# Patient Record
Sex: Male | Born: 1970 | Race: White | Hispanic: No | Smoking: Current every day smoker
Health system: Southern US, Community
[De-identification: ages and names within clinical notes are randomized; demographics above are authoritative.]

## PROBLEM LIST (undated history)

## (undated) DIAGNOSIS — I639 Cerebral infarction, unspecified: Secondary | ICD-10-CM

## (undated) DIAGNOSIS — F319 Bipolar disorder, unspecified: Secondary | ICD-10-CM

## (undated) DIAGNOSIS — R569 Unspecified convulsions: Secondary | ICD-10-CM

## (undated) DIAGNOSIS — C801 Malignant (primary) neoplasm, unspecified: Secondary | ICD-10-CM

## (undated) HISTORY — PX: BACK SURGERY: SHX140

## (undated) HISTORY — PX: FRACTURE SURGERY: SHX138

---

## 2010-11-15 LAB — PULMONARY FUNCTION TEST

## 2012-08-07 ENCOUNTER — Encounter (HOSPITAL_BASED_OUTPATIENT_CLINIC_OR_DEPARTMENT_OTHER): Payer: Self-pay | Admitting: Emergency Medicine

## 2012-08-07 ENCOUNTER — Emergency Department (HOSPITAL_BASED_OUTPATIENT_CLINIC_OR_DEPARTMENT_OTHER): Payer: Self-pay

## 2012-08-07 ENCOUNTER — Emergency Department (HOSPITAL_BASED_OUTPATIENT_CLINIC_OR_DEPARTMENT_OTHER)
Admission: EM | Admit: 2012-08-07 | Discharge: 2012-08-07 | Disposition: A | Discharge status: Home / Self Care | Payer: Self-pay | Attending: Emergency Medicine | Admitting: Emergency Medicine

## 2012-08-07 DIAGNOSIS — R0602 Shortness of breath: Secondary | ICD-10-CM | POA: Insufficient documentation

## 2012-08-07 DIAGNOSIS — F319 Bipolar disorder, unspecified: Secondary | ICD-10-CM | POA: Insufficient documentation

## 2012-08-07 DIAGNOSIS — Z8673 Personal history of transient ischemic attack (TIA), and cerebral infarction without residual deficits: Secondary | ICD-10-CM | POA: Insufficient documentation

## 2012-08-07 DIAGNOSIS — F17200 Nicotine dependence, unspecified, uncomplicated: Secondary | ICD-10-CM | POA: Insufficient documentation

## 2012-08-07 DIAGNOSIS — M549 Dorsalgia, unspecified: Secondary | ICD-10-CM | POA: Insufficient documentation

## 2012-08-07 DIAGNOSIS — J40 Bronchitis, not specified as acute or chronic: Secondary | ICD-10-CM

## 2012-08-07 DIAGNOSIS — M542 Cervicalgia: Secondary | ICD-10-CM | POA: Insufficient documentation

## 2012-08-07 HISTORY — DX: Cerebral infarction, unspecified: I63.9

## 2012-08-07 HISTORY — DX: Malignant (primary) neoplasm, unspecified: C80.1

## 2012-08-07 HISTORY — DX: Bipolar disorder, unspecified: F31.9

## 2012-08-07 HISTORY — DX: Unspecified convulsions: R56.9

## 2012-08-07 LAB — BASIC METABOLIC PANEL
CO2: 23 mEq/L (ref 19–32)
Chloride: 104 mEq/L (ref 96–112)
Creatinine, Ser: 0.9 mg/dL (ref 0.50–1.35)
Potassium: 3.9 mEq/L (ref 3.5–5.1)

## 2012-08-07 LAB — CBC WITH DIFFERENTIAL/PLATELET
Basophils Absolute: 0.1 10*3/uL (ref 0.0–0.1)
HCT: 41.6 % (ref 39.0–52.0)
Hemoglobin: 14.5 g/dL (ref 13.0–17.0)
Lymphocytes Relative: 44 % (ref 12–46)
Monocytes Absolute: 0.9 10*3/uL (ref 0.1–1.0)
Neutro Abs: 5.1 10*3/uL (ref 1.7–7.7)
Neutrophils Relative %: 45 % (ref 43–77)
RDW: 13.2 % (ref 11.5–15.5)
WBC: 11.6 10*3/uL — ABNORMAL HIGH (ref 4.0–10.5)

## 2012-08-07 MED ORDER — CYCLOBENZAPRINE HCL 10 MG PO TABS: 10.0000 mg | ORAL_TABLET | Freq: Three times a day (TID) | ORAL | Status: AC | PRN

## 2012-08-07 MED ORDER — ALBUTEROL SULFATE HFA 108 (90 BASE) MCG/ACT IN AERS
2.0000 | INHALATION_SPRAY | RESPIRATORY_TRACT | Status: DC | PRN
  Administered 2012-08-07: 2 via RESPIRATORY_TRACT
  Filled 2012-08-07: qty 6.7

## 2012-08-07 NOTE — ED Notes (Signed)
C/O neck pain, back pain, headache and sob. Hx of testicular ca, back sx. Hx of "abscence seizures". Has had these sx for "months" but are worsening. In NAD.

## 2012-08-07 NOTE — ED Notes (Signed)
PT REQUESTS NO NARCOTICS-RECOVERING ADDICT

## 2012-08-07 NOTE — ED Provider Notes (Signed)
History     CSN: [id number]  Arrival date & time 08/07/12  4098   First MD Initiated Contact with Patient 08/07/12 [phone]      Chief Complaint  Patient presents with  . Shortness of Breath  . Neck Pain  . Back Pain    (Consider location/radiation/quality/duration/timing/severity/associated sxs/prior treatment) HPI Pt reports a history of chronic back and neck pain, moderate to severe worse with walking and worsening for the last several weeks. No injuries. His more pressing complaint is SOB which is worse with walking or lying flat associated with productive cough.  This is also a chronic complaint for him, he has had PFTs in the past but his outpatient evaluation was interrupted by loss of insurance. He is a smoker. No CP, leg swelling or travel.  Past Medical History  Diagnosis Date  . Cancer   . Stroke   . Seizures   . Bipolar 1 disorder     Past Surgical History  Procedure Date  . Back surgery   . Fracture surgery     No family history on file.  History  Substance Use Topics  . Smoking status: Current Everyday Smoker -- 0.5 packs/day    Types: Cigarettes  . Smokeless tobacco: Not on file  . Alcohol Use: No      Review of Systems All other systems reviewed and are negative except as noted in HPI.   Allergies  Review of patient's allergies indicates no known allergies.  Home Medications   Current Outpatient Rx  Name Route Sig Dispense Refill  . DULOXETINE HCL 60 MG PO CPEP Oral Take 60 mg by mouth daily.    Marland Kitchen HYDROXYZINE HCL 10 MG PO TABS Oral Take 10 mg by mouth 3 (three) times daily as needed.    . TOPIRAMATE 25 MG PO CPSP Oral Take 25 mg by mouth 2 (two) times daily.      BP 112/77  Pulse 82  Temp 98.5 F (36.9 C) (Oral)  Resp 18  Ht 6\' 1"  (1.854 m)  Wt 214 lb (97.07 kg)  BMI 28.23 kg/m2  SpO2 98%  Physical Exam  Nursing note and vitals reviewed. Constitutional: He is oriented to person, place, and time. He appears well-developed and  well-nourished.  HENT:  Head: Normocephalic and atraumatic.  Eyes: EOM are normal. Pupils are equal, round, and reactive to light.  Neck: Normal range of motion. Neck supple.  Cardiovascular: Normal rate, normal heart sounds and intact distal pulses.   Pulmonary/Chest: Effort normal and breath sounds normal.  Abdominal: Bowel sounds are normal. He exhibits no distension. There is no tenderness.  Musculoskeletal: Normal range of motion. He exhibits no edema and no tenderness.  Neurological: He is alert and oriented to person, place, and time. He has normal strength. No cranial nerve deficit or sensory deficit.  Skin: Skin is warm and dry. No rash noted.  Psychiatric: He has a normal mood and affect.    ED Course  Procedures (including critical care time)  Labs Reviewed  CBC WITH DIFFERENTIAL - Abnormal; Notable for the following:    WBC 11.6 (*)     Lymphs Abs 5.1 (*)     All other components within normal limits  BASIC METABOLIC PANEL - Abnormal; Notable for the following:    Glucose, Bld 147 (*)     All other components within normal limits   Dg Chest 2 View  08/07/2012  *RADIOLOGY REPORT*  Clinical Data: Chest pain and shortness of breath.  CHEST -  2 VIEW  Comparison: None  Findings: The cardiac silhouette, mediastinal and hilar contours are within normal limits.  There are bronchitic type lung changes with peribronchial thickening and increased interstitial markings suggesting bronchitis or interstitial pneumonitis.  No focal airspace consolidation or pleural effusion.  The bony thorax is intact.  IMPRESSION: Bronchitic type lung changes suggesting bronchitis or interstitial pneumonitis.  No focal airspace consolidation.   Original Report Authenticated By: P. Loralie Champagne, M.D.      No diagnosis found.    MDM  Exam is unremarkable. Pt has several chronic complaints apparently not well managed by current PCP. Will check basic labs, CXR. Pt states he does not take opiates due  to prior history of substance abuse. He attributes his breathing issues to a period of intubation several years ago following a Seroquel overdose.   11:11 AM CXR as above consistent with symptoms. No concern for acute CHF, pneumonia, PE or ACS. Advised followup with PCP and pulmonology for further evaluation. Given inhaler for SOB, advised to stop smoking. Continue NSAIDs, muscle relaxer for back pain.       Charles B. Bernette Mayers, MD 08/07/12 1112

## 2013-03-24 ENCOUNTER — Ambulatory Visit (INDEPENDENT_AMBULATORY_CARE_PROVIDER_SITE_OTHER): Payer: No Typology Code available for payment source | Admitting: Pulmonary Disease

## 2013-03-24 ENCOUNTER — Encounter: Payer: Self-pay | Admitting: Pulmonary Disease

## 2013-03-24 VITALS — BP 96/62 | HR 84 | Temp 97.7°F | Ht 73.0 in | Wt 246.0 lb

## 2013-03-24 DIAGNOSIS — R911 Solitary pulmonary nodule: Secondary | ICD-10-CM

## 2013-03-24 DIAGNOSIS — R0600 Dyspnea, unspecified: Secondary | ICD-10-CM | POA: Insufficient documentation

## 2013-03-24 DIAGNOSIS — R0989 Other specified symptoms and signs involving the circulatory and respiratory systems: Secondary | ICD-10-CM

## 2013-03-24 DIAGNOSIS — G4733 Obstructive sleep apnea (adult) (pediatric): Secondary | ICD-10-CM

## 2013-03-24 NOTE — Assessment & Plan Note (Signed)
Lung function is decreased - FVC dropped from 66% in 2011 to 56% 2014 You have to quit smoking -counselled Use albuterol 2 puffs every 6h as needed for shortness of breath or wheezing

## 2013-03-24 NOTE — Progress Notes (Signed)
Subjective:    Patient ID: Kyle Sanders, male    DOB: 12/09/1971, 42 y.o.   MRN: [id number]  HPI 42 year old heavy smoker presents for evaluation of dyspnea and loud snoring. He has a history of bipolar disorder and substance abuse. He quit using street drugs such as cocaine in 1995. He is an extensive hospitalization in [place] in 2010 after an overdose of Seroquel when he required mechanical ventilation and cardiac resuscitation. 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. He also has a history of testicular cancer in remission since 2008. He ambulates with a cane and reports dyspnea on exertion. He denies wheezing frequent chest colds or episodes of orthopnea or paroxysmal nocturnal dyspnea. PFTs in 2011 showed an FEV1 of 68% and FVC of 66% with a ratio of 85 suggesting moderate restriction. DLCO was decreased at 58% but corrected for alveolar volume. Lung volumes were decreased. He did have a 20% drop in FEV1 after broncho- challenge.  Spirometry today showed an FEV1 of 65% and FVC of 56% with a normal ratio against suggesting restriction CXR 08/07/12 showed Bronchitic type lung changes suggesting bronchitis or interstitial pneumonitis. Cardiac evaluation including nuclear imaging on 03/19/2013 was normal with an EF of 64%. A recent drug screen was negative as was a hypercoagulable panel on review of his labs.  A lung nodule is reported in his medical history from PCP but I did not see any chest x-ray or CT report.  He also reports witnessed apneas and loud snoring. Bedtime is 2 AM to sleep latency around 2 hours and several nocturnal awakenings he is out of bed by 7 AM, feeling tired and unrefreshed with dryness of mouth. He is gained 50 pounds in the last 2 years. There is no history suggestive of cataplexy, sleep paralysis or parasomnias    Past Medical History  Diagnosis Date  . Cancer   . Stroke   . Seizures   . Bipolar 1  disorder    Past Surgical History  Procedure Laterality Date  . Back surgery    . Fracture surgery      Allergies  Allergen Reactions  . Lamictal (Lamotrigine) Rash   History   Social History  . Marital Status: Divorced    Spouse Name: N/A    Number of Children: N/A  . Years of Education: N/A   Occupational History  . Not on file.   Social History Main Topics  . Smoking status: Current Every Day Smoker -- 1.00 packs/day for 30 years    Types: Cigarettes  . Smokeless tobacco: Not on file  . Alcohol Use: No  . Drug Use: No     Comment: history of illegal drug use  . Sexually Active: Not on file   Other Topics Concern  . Not on file   Social History Narrative  . No narrative on file   History reviewed. No pertinent family history.    Review of Systems  Constitutional: Positive for unexpected weight change. Negative for fever, chills, diaphoresis, activity change, appetite change and fatigue.  HENT: Positive for ear pain, congestion and sore throat. Negative for hearing loss, nosebleeds, facial swelling, rhinorrhea, sneezing, mouth sores, trouble swallowing, neck pain, neck stiffness, dental problem, voice change, postnasal drip, sinus pressure, tinnitus and ear discharge.   Eyes: Negative for photophobia, discharge, itching and visual disturbance.  Respiratory: Positive for cough and shortness of breath. Negative for apnea, choking, chest tightness, wheezing and stridor.  Cardiovascular: Positive for chest pain. Negative for palpitations and leg swelling.  Gastrointestinal: Positive for abdominal pain. Negative for nausea, vomiting, constipation, blood in stool and abdominal distention.  Genitourinary: Negative for dysuria, urgency, frequency, hematuria, flank pain, decreased urine volume and difficulty urinating.  Musculoskeletal: Negative for myalgias, back pain, joint swelling, arthralgias and gait problem.  Skin: Negative for color change, pallor and rash.   Neurological: Positive for headaches. Negative for dizziness, tremors, seizures, syncope, speech difficulty, weakness, light-headedness and numbness.  Hematological: Negative for adenopathy. Does not bruise/bleed easily.  Psychiatric/Behavioral: Negative for confusion, sleep disturbance and agitation. The patient is not nervous/anxious.        Objective:   Physical Exam  Gen. Pleasant, well-nourished, in no distress, normal affect ENT - no lesions, no post nasal drip, pierced tongue Neck: No JVD, no thyromegaly, no carotid bruits Lungs: no use of accessory muscles, no dullness to percussion, clear without rales or rhonchi  Cardiovascular: Rhythm regular, heart sounds  normal, no murmurs or gallops, no peripheral edema Abdomen: soft and non-tender, no hepatosplenomegaly, BS normal. Musculoskeletal: No deformities, no cyanosis or clubbing, ambulates with walker Neuro:  alert, non focal       Assessment & Plan:

## 2013-03-24 NOTE — Assessment & Plan Note (Signed)
CT scan of chest to clarify lung nodule

## 2013-03-24 NOTE — Patient Instructions (Addendum)
Lung function is decreased You have to quit smoking Use albuterol 2 puffs every 6h as needed for shortness of breath or wheezing CT scan of chest to clarify lung nodule Sleep study will be scheduled

## 2013-03-24 NOTE — Assessment & Plan Note (Signed)
Given excessive daytime somnolence, narrow pharyngeal exam, witnessed apneas & loud snoring, obstructive sleep apnea is very likely & an overnight polysomnogram will be scheduled as a split study. The pathophysiology of obstructive sleep apnea , it's cardiovascular consequences & modes of treatment including CPAP were discused with the patient in detail & they evidenced understanding.  

## 2013-03-25 ENCOUNTER — Ambulatory Visit (HOSPITAL_BASED_OUTPATIENT_CLINIC_OR_DEPARTMENT_OTHER): Payer: No Typology Code available for payment source

## 2013-03-26 ENCOUNTER — Ambulatory Visit (HOSPITAL_BASED_OUTPATIENT_CLINIC_OR_DEPARTMENT_OTHER)
Admission: RE | Admit: 2013-03-26 | Discharge: 2013-03-26 | Disposition: A | Discharge status: Home / Self Care | Payer: No Typology Code available for payment source | Source: Ambulatory Visit | Attending: Pulmonary Disease | Admitting: Pulmonary Disease

## 2013-03-26 DIAGNOSIS — R911 Solitary pulmonary nodule: Secondary | ICD-10-CM

## 2013-03-26 DIAGNOSIS — J438 Other emphysema: Secondary | ICD-10-CM | POA: Insufficient documentation

## 2013-03-26 DIAGNOSIS — J479 Bronchiectasis, uncomplicated: Secondary | ICD-10-CM | POA: Insufficient documentation

## 2013-03-26 DIAGNOSIS — F17200 Nicotine dependence, unspecified, uncomplicated: Secondary | ICD-10-CM | POA: Insufficient documentation

## 2013-03-31 ENCOUNTER — Telehealth: Payer: Self-pay | Admitting: Pulmonary Disease

## 2013-03-31 DIAGNOSIS — R911 Solitary pulmonary nodule: Secondary | ICD-10-CM

## 2013-03-31 NOTE — Telephone Encounter (Signed)
Called spoke with patient Advised of RA's recs as stated below Pt verbalized his understanding and will discuss at next ov in June (on the recall list) In the meantime, pt stated he is ready to quit smoking (reports currently smoking 1/2 ppd) Would like recs/rx to assist with this Dr Vassie Loll please advise, thanks  [facility]

## 2013-03-31 NOTE — Telephone Encounter (Signed)
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

## 2013-03-31 NOTE — Telephone Encounter (Signed)
Called spoke with patient regarding 4.10.14 CT results / recs Pt aware and verbalized his understanding Order placed for repeat CT to be done in 6 months Patient reported that he viewed the CT report thru mychart and is questioning the mention of emphysema and bronchiectasis Pt is requesting clarification from RA regarding this Dr Vassie Loll please advise, thanks.  **split night study is scheduled for 5.4.14.

## 2013-03-31 NOTE — Telephone Encounter (Signed)
Nicotine patch 21 mg/d or nicotine gum or nicotine inhaler available (needs Rx) I would not Rx chantix or welbutrin due to psych meds that he is on - he will need to discuss with psychiatrist

## 2013-03-31 NOTE — Progress Notes (Signed)
Quick Note:  Called spoke with patient, advised of CT results / recs as stated by RA. Pt verbalized his understanding but does report some questions. Telephone encounter created and routed to RA to address. ______

## 2013-04-02 NOTE — Telephone Encounter (Signed)
I spoke with pt and he stated he will try the nicotine patch OTC. Nothing further was needed

## 2013-04-14 ENCOUNTER — Emergency Department (HOSPITAL_BASED_OUTPATIENT_CLINIC_OR_DEPARTMENT_OTHER): Payer: No Typology Code available for payment source

## 2013-04-14 ENCOUNTER — Emergency Department (HOSPITAL_BASED_OUTPATIENT_CLINIC_OR_DEPARTMENT_OTHER)
Admission: EM | Admit: 2013-04-14 | Discharge: 2013-04-14 | Disposition: A | Discharge status: Home / Self Care | Payer: No Typology Code available for payment source | Attending: Emergency Medicine | Admitting: Emergency Medicine

## 2013-04-14 ENCOUNTER — Encounter (HOSPITAL_BASED_OUTPATIENT_CLINIC_OR_DEPARTMENT_OTHER): Payer: Self-pay | Admitting: *Deleted

## 2013-04-14 ENCOUNTER — Telehealth: Payer: Self-pay | Admitting: Pulmonary Disease

## 2013-04-14 DIAGNOSIS — F17200 Nicotine dependence, unspecified, uncomplicated: Secondary | ICD-10-CM | POA: Insufficient documentation

## 2013-04-14 DIAGNOSIS — G40909 Epilepsy, unspecified, not intractable, without status epilepticus: Secondary | ICD-10-CM | POA: Insufficient documentation

## 2013-04-14 DIAGNOSIS — Z8673 Personal history of transient ischemic attack (TIA), and cerebral infarction without residual deficits: Secondary | ICD-10-CM | POA: Insufficient documentation

## 2013-04-14 DIAGNOSIS — F319 Bipolar disorder, unspecified: Secondary | ICD-10-CM | POA: Insufficient documentation

## 2013-04-14 DIAGNOSIS — K649 Unspecified hemorrhoids: Secondary | ICD-10-CM | POA: Insufficient documentation

## 2013-04-14 DIAGNOSIS — Z7982 Long term (current) use of aspirin: Secondary | ICD-10-CM | POA: Insufficient documentation

## 2013-04-14 DIAGNOSIS — Z859 Personal history of malignant neoplasm, unspecified: Secondary | ICD-10-CM | POA: Insufficient documentation

## 2013-04-14 DIAGNOSIS — K921 Melena: Secondary | ICD-10-CM | POA: Insufficient documentation

## 2013-04-14 DIAGNOSIS — Z79899 Other long term (current) drug therapy: Secondary | ICD-10-CM | POA: Insufficient documentation

## 2013-04-14 DIAGNOSIS — R799 Abnormal finding of blood chemistry, unspecified: Secondary | ICD-10-CM

## 2013-04-14 DIAGNOSIS — K625 Hemorrhage of anus and rectum: Secondary | ICD-10-CM

## 2013-04-14 LAB — BASIC METABOLIC PANEL
BUN: 27 mg/dL — ABNORMAL HIGH (ref 6–23)
CO2: 20 mEq/L (ref 19–32)
Chloride: 110 mEq/L (ref 96–112)
Creatinine, Ser: 1 mg/dL (ref 0.50–1.35)
Potassium: 4 mEq/L (ref 3.5–5.1)

## 2013-04-14 LAB — CBC WITH DIFFERENTIAL/PLATELET
HCT: 39.4 % (ref 39.0–52.0)
Hemoglobin: 13.6 g/dL (ref 13.0–17.0)
Lymphocytes Relative: 39 % (ref 12–46)
Lymphs Abs: 4.1 10*3/uL — ABNORMAL HIGH (ref 0.7–4.0)
MCHC: 34.5 g/dL (ref 30.0–36.0)
Monocytes Absolute: 1 10*3/uL (ref 0.1–1.0)
Monocytes Relative: 10 % (ref 3–12)
Neutro Abs: 5.1 10*3/uL (ref 1.7–7.7)
Neutrophils Relative %: 48 % (ref 43–77)
RBC: 4.47 MIL/uL (ref 4.22–5.81)
WBC: 10.6 10*3/uL — ABNORMAL HIGH (ref 4.0–10.5)

## 2013-04-14 LAB — OCCULT BLOOD X 1 CARD TO LAB, STOOL: Fecal Occult Bld: POSITIVE — AB

## 2013-04-14 NOTE — Telephone Encounter (Signed)
Pt followed by Dr. Vassie Loll.  He continues to have trouble with his breathing.  He has tried using his inhalers, but these do not help much.  He has to stop to catch his breath after speaking 2 or 3 words on the phone.  He also notes having rectal bleeding.  I have advised him to go to either [facility] or [facility] emergency room, and have ER staff notify PCCM on arrival if hospital admission needed.

## 2013-04-14 NOTE — ED Notes (Signed)
Np at bedside

## 2013-04-14 NOTE — ED Notes (Signed)
Rectal bleeding with bright red rectal blood on the tissue for the past 2 weeks. Sob.

## 2013-04-14 NOTE — Telephone Encounter (Signed)
Pt called back He stated that he contacted [facility] and a copy of his CT and labs dated 4.24.14 are to be faxed here I have checked the medical records and triage fax machines w/ no sign of these records Advised pt we will continue to watch for these Pt is wanting a call back ASAP Will forward back to [facility]

## 2013-04-14 NOTE — ED Notes (Signed)
Patient transported to X-ray 

## 2013-04-14 NOTE — Telephone Encounter (Signed)
Patient called back to elink stating that he was confused and went to [facility] [place] [facility].  I advised him to have ER physician evaluate his status, and then the ER can contact PCCM if hospital admission needed.  Otherwise will route message to Dr. Vassie Loll to contact pt to discuss pulmonary status.

## 2013-04-14 NOTE — Telephone Encounter (Signed)
[facility] will not fax report w/o release. I called pt and he stated he will fax this over. It may not be until in the AM. Will hold in my box.

## 2013-04-14 NOTE — Telephone Encounter (Signed)
Pl obtain CT report from [facility] - out chest CT did not show collpased lung Very often abdominal CT picks up 'atelectasis' - some crowding of bottom part of lung when you lie down

## 2013-04-14 NOTE — ED Provider Notes (Signed)
Patient seen/examined in the Emergency Department in conjunction with Midlevel Provider [place] Patient reports shortness of breath (chronic) and rectal bleeding Exam : awake/alert, no distress.   Plan: labs reviewed and no signs of acute anemia and per NP, no signs of acute GI bleed on rectal exam Stable for d/c and followup with GI and pulmonology   Joya Gaskins, MD 04/14/13 2206

## 2013-04-14 NOTE — Telephone Encounter (Signed)
I spoke with pt. He stated he was in [facility]. He had an abdominal pelvis CT scan w/ contrasts. He stated he googled a word it mentions int here it stated it means a collapsed lung. He wouold like RA to look at this. We can pull this up on out PACS. Will forward to RA for this afternoon. Please advise thanks

## 2013-04-14 NOTE — ED Provider Notes (Signed)
Medical screening examination/treatment/procedure(s) were conducted as a shared visit with non-physician practitioner(s) and myself.  I personally evaluated the patient during the encounter   Joya Gaskins, MD 04/14/13 2212

## 2013-04-14 NOTE — ED Provider Notes (Signed)
History     CSN: [id number]  Arrival date & time 04/14/13  1944   First MD Initiated Contact with Patient 04/14/13 1952      Chief Complaint  Patient presents with  . Rectal Bleeding    (Consider location/radiation/quality/duration/timing/severity/associated sxs/prior treatment) HPI Comments: Pt states that he has been having increased rectal bleeding in the last week but it has been going on for several weeks:pt is also being working up by pulmonary for a bilateral lung nodules, but pt has not had a biopsy yet:pt was [facility] ago and had a ct scan and he was concerned that because the ct showed atelectasis in the lungs and nothing was done and he is continuing to have sob:pt states the he talked to Dr. Craige Cotta with pulmonary and he was told to come in and be seen:pt has a history of testicular cancer in 2008  Patient is a 42 y.o. male presenting with hematochezia. The history is provided by the patient. No language interpreter was used.  Rectal Bleeding  The current episode started more than 2 weeks ago. The onset was gradual. The problem occurs continuously. The problem has been unchanged. The pain is mild. The stool is described as soft and hard. There was no prior successful therapy. There was no prior unsuccessful therapy. Associated symptoms include hemorrhoids. Pertinent negatives include no nausea, no rectal pain, no hematuria and no coughing.    Past Medical History  Diagnosis Date  . Cancer   . Stroke   . Seizures   . Bipolar 1 disorder     Past Surgical History  Procedure Laterality Date  . Back surgery    . Fracture surgery      No family history on file.  History  Substance Use Topics  . Smoking status: Current Every Day Smoker -- 1.00 packs/day for 30 years    Types: Cigarettes  . Smokeless tobacco: Not on file  . Alcohol Use: No      Review of Systems  Constitutional: Negative.   Respiratory: Negative for cough.   Gastrointestinal:  Positive for hematochezia and hemorrhoids. Negative for nausea and rectal pain.  Genitourinary: Negative for hematuria.    Allergies  Lamictal  Home Medications   Current Outpatient Rx  Name  Route  Sig  Dispense  Refill  . albuterol (PROVENTIL HFA;VENTOLIN HFA) 108 (90 BASE) MCG/ACT inhaler   Inhalation   Inhale 2 puffs into the lungs every 6 (six) hours as needed for wheezing.         . ARIPiprazole (ABILIFY) 2 MG tablet   Oral   Take 2 mg by mouth 2 (two) times daily.         Marland Kitchen aspirin 81 MG chewable tablet   Oral   Chew 81 mg by mouth daily.         . beclomethasone (QVAR) 40 MCG/ACT inhaler   Inhalation   Inhale 2 puffs into the lungs 2 (two) times daily.         . hydrOXYzine (ATARAX/VISTARIL) 50 MG tablet   Oral   Take 50 mg by mouth 2 (two) times daily.         . naproxen (NAPROSYN) 500 MG tablet   Oral   Take 500 mg by mouth 2 (two) times daily with a meal.         . topiramate (TOPAMAX) 25 MG capsule   Oral   Take 75 mg by mouth 2 (two) times daily.          Marland Kitchen  Vilazodone HCl (VIIBRYD) 40 MG TABS   Oral   Take 40 mg by mouth daily.           BP 122/72  Pulse 97  Temp(Src) 98.5 F (36.9 C) (Oral)  Resp 20  Wt 246 lb (111.585 kg)  BMI 32.46 kg/m2  SpO2 100%  Physical Exam  Nursing note and vitals reviewed. Constitutional: He is oriented to person, place, and time. He appears well-developed and well-nourished.  HENT:  Head: Normocephalic and atraumatic.  Eyes: Conjunctivae and EOM are normal.  Neck: Normal range of motion. Neck supple.  Cardiovascular: Normal rate and regular rhythm.   Pulmonary/Chest: Effort normal. He has rales.  Abdominal: Soft. Bowel sounds are normal. There is no tenderness.  Genitourinary:  External hemorrhoids and stool normal color  Musculoskeletal: Normal range of motion.  Neurological: He is alert and oriented to person, place, and time.  Skin: Skin is warm and dry.  Psychiatric: He has a normal mood  and affect.    ED Course  Procedures (including critical care time)  Labs Reviewed  OCCULT BLOOD X 1 CARD TO LAB, STOOL - Abnormal; Notable for the following:    Fecal Occult Bld POSITIVE (*)    All other components within normal limits  CBC WITH DIFFERENTIAL - Abnormal; Notable for the following:    WBC 10.6 (*)    Lymphs Abs 4.1 (*)    All other components within normal limits  BASIC METABOLIC PANEL - Abnormal; Notable for the following:    Glucose, Bld 107 (*)    BUN 27 (*)    All other components within normal limits   Dg Chest 2 View  04/14/2013  *RADIOLOGY REPORT*  Clinical Data: Shortness of breath.  Rectal bleeding.  CHEST - 2 VIEW  Comparison: 08/07/2012 and CT dated 03/26/2013.  Findings: Normal sized heart.  Clear lungs.  Mild central peribronchial thickening with improvement.  Small amount of anterior basilar curvilinear density without significant change. Unremarkable bones.  IMPRESSION:  1.  No acute abnormality. 2.  Mild bronchitic changes with improvement. 3.  Stable anterior basilar scarring.   Original Report Authenticated By: Beckie Salts, M.D.      1. Rectal bleeding   2. Elevated BUN       MDM  Obtained [facility] and ct of abdomen was negative, blood counts where normal:pt blood counts continue to be normal:bun elevated x-ray is negative:pt is okay to follow up with pcp or pulmonary as needed        Teressa Lower, NP 04/14/13 2201

## 2013-04-15 ENCOUNTER — Ambulatory Visit (HOSPITAL_BASED_OUTPATIENT_CLINIC_OR_DEPARTMENT_OTHER): Payer: No Typology Code available for payment source | Attending: Pulmonary Disease | Admitting: Radiology

## 2013-04-15 VITALS — Ht 73.0 in | Wt 246.0 lb

## 2013-04-15 DIAGNOSIS — R0609 Other forms of dyspnea: Secondary | ICD-10-CM | POA: Insufficient documentation

## 2013-04-15 DIAGNOSIS — R569 Unspecified convulsions: Secondary | ICD-10-CM | POA: Insufficient documentation

## 2013-04-15 DIAGNOSIS — G4733 Obstructive sleep apnea (adult) (pediatric): Secondary | ICD-10-CM

## 2013-04-15 DIAGNOSIS — R0989 Other specified symptoms and signs involving the circulatory and respiratory systems: Secondary | ICD-10-CM | POA: Insufficient documentation

## 2013-04-15 DIAGNOSIS — F319 Bipolar disorder, unspecified: Secondary | ICD-10-CM | POA: Insufficient documentation

## 2013-04-15 DIAGNOSIS — G4710 Hypersomnia, unspecified: Secondary | ICD-10-CM | POA: Insufficient documentation

## 2013-04-15 NOTE — Telephone Encounter (Signed)
I discussed meaning of dependent atelectasis with him Also reassured him Pl make appt at [facility] - next available - to go over his images & sleep test

## 2013-04-15 NOTE — Telephone Encounter (Signed)
Report has been received and I have it. Please advise RA thanks

## 2013-04-15 NOTE — Telephone Encounter (Signed)
Spoke with pt He spoke with someone this am and they are supposed to be faxing report to Mindy's attn  Will forward to her so she is aware

## 2013-04-15 NOTE — Telephone Encounter (Signed)
i spoke with pt and made hima ware still have not received report. He is going to call and to see if this can be faxed ASAP. Will hold this in my box

## 2013-04-15 NOTE — Telephone Encounter (Signed)
F/u has been made for 5/20.14

## 2013-04-19 ENCOUNTER — Encounter (HOSPITAL_BASED_OUTPATIENT_CLINIC_OR_DEPARTMENT_OTHER): Payer: No Typology Code available for payment source

## 2013-04-20 DIAGNOSIS — G4733 Obstructive sleep apnea (adult) (pediatric): Secondary | ICD-10-CM

## 2013-04-21 NOTE — Procedures (Signed)
NAME:  Kyle, Sanders                 ACCOUNT NO.:  [id number]  MEDICAL RECORD NO.:  [id number]          PATIENT TYPE:  OUT  LOCATION:  SLEEP CENTER                 FACILITY:  [facility]  PHYSICIAN:  Oretha Milch, MD      DATE OF BIRTH:  Dec 08, 1971  DATE OF STUDY:  04/15/2013                           NOCTURNAL POLYSOMNOGRAM  REFERRING PHYSICIAN:  Oretha Milch, MD  INDICATION FOR THE STUDY:  Excessive daytime fatigue, loud snoring, and frequent napping in this 42 year old gentleman with bipolar and seizure disorder.  At the time of this study, he weighed 246 pounds with a height of 6 feet 1 inch, BMI of 32, neck size of 18 inch.  EPWORTH SLEEPINESS SCORE:  15.  BEDTIME MEDICATIONS:  Included naproxen, topiramate, Vistaril, and Abilify at 8 p.m.  This nocturnal polysomnogram was performed with sleep technologist in attendance.  EEG, EOG, EMG, EKG, and respiratory parameters were recorded.  Sleep stages, arousals, limb movements, and respiratory data were scored according to criteria laid out by the American Academy of Sleep Medicine.  SLEEP ARCHITECTURE:  Lights out was at 9:52 p.m.  Lights on was at 4:58 a.m.  Total sleep time was 394 minutes with a sleep period time of 408 minutes and sleep efficiency of 92%.  Sleep latency was 17 minutes. Latency to REM sleep was 227 minutes and wake after sleep onset was 15 minutes.  Sleep stages as a percentage of total sleep time was N1 3%, N2 75% N3 0%, REM sleep 17%.  Supine sleep accounted for 194 minutes. Supine REM sleep was not noted.  REM sleep was noted in 2 long stages around 2:00 am and 4:00 am.  RESPIRATORY DATA:  There were 1 obstructive apnea, 2 central apneas, 0 mixed apneas, and 1 hypopnea with apnea-hypopnea index of 0.6 events per hour.  No RERAs were noted.  AROUSAL DATA:  There were 53 spontaneous arousals with an arousal index of 8 events per hour.  LIMB MOVEMENT DATA:  No significant limb movements were noted.  OXYGEN  SATURATION DATA:  The desaturation index was 1.5 events per hour. The lowest desaturation was 92%  CARDIAC DATA:  The low heart rate was 31 beats per minute.  The high heart rate recorded was an artifact.  No arrhythmias were noted.  DISCUSSION:  Loud snoring was noted.  He was fitted with a medium fullface mask, but did not require intervention.  IMPRESSION: 1. No evidence of obstructive sleep apnea. 2. Loud snoring was noted. 3. No evidence of cardiac arrhythmias, limb movements, or behavioral     disturbance during sleep.  RECOMMENDATION:  The etiology of his excessive daytime somnolence is not evident on this study.  This may be related to his medications.  Sleep hygiene should be discussed.  Weight loss can be advised.  Therapies for snoring can be discussed.     Oretha Milch, MD    RVA/MEDQ  D:  04/20/2013 14:27:11  T:  04/21/2013 01:37:44  Job:  [id number]

## 2013-05-05 ENCOUNTER — Ambulatory Visit (INDEPENDENT_AMBULATORY_CARE_PROVIDER_SITE_OTHER): Payer: No Typology Code available for payment source | Admitting: Pulmonary Disease

## 2013-05-05 ENCOUNTER — Encounter: Payer: Self-pay | Admitting: Pulmonary Disease

## 2013-05-05 VITALS — BP 110/76 | HR 90 | Temp 98.1°F | Ht 73.0 in | Wt 252.0 lb

## 2013-05-05 DIAGNOSIS — R911 Solitary pulmonary nodule: Secondary | ICD-10-CM

## 2013-05-05 DIAGNOSIS — R0600 Dyspnea, unspecified: Secondary | ICD-10-CM

## 2013-05-05 DIAGNOSIS — R0609 Other forms of dyspnea: Secondary | ICD-10-CM

## 2013-05-05 MED ORDER — FUROSEMIDE 20 MG PO TABS: 20.0000 mg | ORAL_TABLET | Freq: Every day | ORAL | Status: DC

## 2013-05-05 NOTE — Progress Notes (Signed)
  Subjective:    Patient ID: Kyle Sanders, male    DOB: [birth year], 42 y.o.   MRN: [id number]  HPI 42 year old heavy smoker presents for FU of dyspnea and loud snoring.  He has a history of bipolar disorder and substance abuse. He quit using street drugs such as cocaine in 1995. He is an extensive hospitalization in [place] in 2010 after an overdose of Seroquel when he required mechanical ventilation and cardiac resuscitation. 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 He also has a history of testicular cancer in remission since 2008.  He ambulates with a cane and reports dyspnea on exertion. He denies wheezing frequent chest colds or episodes of orthopnea or paroxysmal nocturnal dyspnea. PFTs in 2011 showed an FEV1 of 68% and FVC of 66% with a ratio of 85 suggesting moderate restriction. DLCO was decreased at 58% but corrected for alveolar volume. Lung volumes were decreased. He did have a 20% drop in FEV1 after broncho- challenge.  Spirometry today showed an FEV1 of 65% and FVC of 56% with a normal ratio against suggesting restriction  CXR 08/07/12 showed Bronchitic type lung changes suggesting bronchitis or interstitial pneumonitis.  Cardiac evaluation including nuclear imaging on 03/19/2013 was normal with an EF of 64%.  A recent drug screen was negative as was a hypercoagulable panel on review of his labs.  A lung nodule is reported in his medical history from PCP but I did not see any chest x-ray or CT report.     05/05/2013 Continues to smoke 42/2 PPD - Did not try nicotine patch - cannot afford discussed meaning of dependent atelectasis (noted on CT)with him Reviewed CT - sub cm nodules bilateral, mild bronchiectasis & scarring in lingula - he was concerned about this PSG reviewed - No evidence of obstructive sleep apnea. Loud snoring was noted.  No evidence of cardiac arrhythmias, limb movements, or behavioral  disturbance during  sleep.  He c/o dyspnea & reports mild pedal edema -no orthopnea or PND   Past Medical History  Diagnosis Date  . Cancer   . Stroke   . Seizures   . Bipolar 1 disorder        Review of Systems neg for any significant sore throat, dysphagia, itching, sneezing, nasal congestion or excess/ purulent secretions, fever, chills, sweats, unintended wt loss, pleuritic or exertional cp, hempoptysis, orthopnea pnd or change in chronic leg swelling. Also denies presyncope, palpitations, heartburn, abdominal pain, nausea, vomiting, diarrhea or change in bowel or urinary habits, dysuria,hematuria, rash, arthralgias, visual complaints, headache, numbness weakness or ataxia.     Objective:   Physical Exam  Gen. Pleasant, obese, in no distress, normal affect ENT - no lesions, no post nasal drip, class 2-3 airway Neck: No JVD, no thyromegaly, no carotid bruits Lungs: no use of accessory muscles, no dullness to percussion, decreased without rales or rhonchi  Cardiovascular: Rhythm regular, heart sounds  normal, no murmurs or gallops, 1+ peripheral edema Abdomen: soft and non-tender, no hepatosplenomegaly, BS normal. Musculoskeletal: No deformities, no cyanosis or clubbing Neuro:  alert, non focal, no tremors       Assessment & Plan:

## 2013-05-05 NOTE — Patient Instructions (Signed)
Lasix 20 mg daily x 3 days Sample of symbicort 160 2 puffs twice daily- call for Rx  if this works Rpt CT scan in october

## 2013-05-05 NOTE — Assessment & Plan Note (Signed)
Rpt CT scan in October for 49m FU

## 2013-05-05 NOTE — Assessment & Plan Note (Addendum)
Trial of Lasix 20 mg daily x 3 days Given drop in lung function & persistent symptoms, will Rx as COPD with Sample of symbicort 160 2 puffs twice daily- call for Rx  if this works

## 2013-05-20 ENCOUNTER — Telehealth: Payer: Self-pay | Admitting: Pulmonary Disease

## 2013-05-20 DIAGNOSIS — R0600 Dyspnea, unspecified: Secondary | ICD-10-CM

## 2013-05-20 NOTE — Telephone Encounter (Signed)
I spoke with pt. He stated The symbicort 160 has not helped with his breathing. He stated he would use this after his breathing TX and it would help get him a deeper breath but would not last very long. He still feels SOB. He also states the lasix has not taken any of the fluid off of him. I offered OV but per pt he is out of town and can not come in. He stated he will wait until RA returns back in the office on Friday for recs. He states if he worsens in the meantime then he would go to an ER. Please advise RA thanks  Allergies  Allergen Reactions  . Lamictal (Lamotrigine) Rash

## 2013-05-22 NOTE — Telephone Encounter (Signed)
Stay on symbicort Increase lasix to 40 mg daily x 5 ds

## 2013-05-22 NOTE — Telephone Encounter (Signed)
lmomtcb x1 for pt 

## 2013-05-25 MED ORDER — FUROSEMIDE 20 MG PO TABS: 40.0000 mg | ORAL_TABLET | Freq: Every day | ORAL | Status: DC

## 2013-05-25 NOTE — Telephone Encounter (Signed)
Pt advised and refill sent. Latrisa Hellums, CMA  

## 2013-06-01 ENCOUNTER — Telehealth: Payer: Self-pay | Admitting: Pulmonary Disease

## 2013-06-01 DIAGNOSIS — R0600 Dyspnea, unspecified: Secondary | ICD-10-CM

## 2013-06-01 MED ORDER — FUROSEMIDE 20 MG PO TABS: 40.0000 mg | ORAL_TABLET | Freq: Every day | ORAL | Status: DC

## 2013-06-01 NOTE — Telephone Encounter (Signed)
I spoke with pt. He stated he is still retaining fluid in his legs and stomach. He stated he is still over 250 lbs. He is not drinking soda but is drinking water. He stated he is not able to use the bathroom even if he tries. The lasix has not helped any. He is requesting further recs or a refill on lasix. Please advise Dr. Vassie Loll thanks  Allergies  Allergen Reactions  . Lamictal (Lamotrigine) Rash

## 2013-06-01 NOTE — Telephone Encounter (Signed)
Refill lasix 40 mg daily x 10 sd Chk BMET in 1 wk & arrange Fu with TP in [place]

## 2013-06-01 NOTE — Telephone Encounter (Signed)
Pt aware of recs. rx sent. Nothing further was needed

## 2013-06-09 ENCOUNTER — Ambulatory Visit: Payer: No Typology Code available for payment source | Admitting: Adult Health

## 2013-06-09 ENCOUNTER — Encounter: Payer: Self-pay | Admitting: Pulmonary Disease

## 2013-06-09 ENCOUNTER — Ambulatory Visit (INDEPENDENT_AMBULATORY_CARE_PROVIDER_SITE_OTHER): Payer: No Typology Code available for payment source | Admitting: Pulmonary Disease

## 2013-06-09 ENCOUNTER — Other Ambulatory Visit: Payer: Self-pay | Admitting: Pulmonary Disease

## 2013-06-09 VITALS — BP 124/76 | HR 85 | Ht 73.0 in | Wt 251.0 lb

## 2013-06-09 DIAGNOSIS — R911 Solitary pulmonary nodule: Secondary | ICD-10-CM

## 2013-06-09 DIAGNOSIS — R0600 Dyspnea, unspecified: Secondary | ICD-10-CM

## 2013-06-09 DIAGNOSIS — R0609 Other forms of dyspnea: Secondary | ICD-10-CM

## 2013-06-09 MED ORDER — BUDESONIDE-FORMOTEROL FUMARATE 160-4.5 MCG/ACT IN AERO: 2.0000 | INHALATION_SPRAY | Freq: Two times a day (BID) | RESPIRATORY_TRACT | Status: AC

## 2013-06-09 NOTE — Progress Notes (Signed)
  Subjective:    Patient ID: Kyle Sanders, male    DOB: 1971/10/27, 41 y.o.   MRN: [id number]  HPI 42 year old heavy smoker presents for FU of dyspnea and loud snoring.  He has a history of bipolar disorder and substance abuse. He quit using street drugs such as cocaine in 1995. He is an extensive hospitalization in [place] in 2010 after an overdose of Seroquel when he required mechanical ventilation and cardiac resuscitation. 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 He also has a history of testicular cancer in remission since 2008.  He ambulates with a cane and reports dyspnea on exertion. He denies wheezing frequent chest colds or episodes of orthopnea or paroxysmal nocturnal dyspnea. PFTs in 2011 showed an FEV1 of 68% and FVC of 66% with a ratio of 85 suggesting moderate restriction. DLCO was decreased at 58% but corrected for alveolar volume. Lung volumes were decreased. He did have a 20% drop in FEV1 after broncho- challenge.  Spirometry today showed an FEV1 of 65% and FVC of 56% with a normal ratio against suggesting restriction  CXR 08/07/12 showed Bronchitic type lung changes suggesting bronchitis or interstitial pneumonitis.  Cardiac evaluation including nuclear imaging on 03/19/2013 was normal with an EF of 64%.  A recent drug screen was negative as was a hypercoagulable panel on review of his labs.    CT chest 04/2013  - sub cm nodules bilateral, mild bronchiectasis & scarring in lingula PSG reviewed - No evidence of obstructive sleep apnea. Loud snoring was noted. No evidence of cardiac arrhythmias, limb movements, or behavioral  disturbance during sleep.     06/09/2013 ON last visit - trial of lasix for pedal edema ,symbicort The symbicort 160 has not helped with his breathing. He stated he would use this after his breathing TX and it would help get him a deeper breath but would not last very long. He still feels SOB. He also  states the lasix has not taken any of the fluid off of him. >> increased to lasix 40 x 5ds He stated he is still retaining fluid in his legs and stomach. He stated he is still over 250 lbs. He is not drinking soda but is drinking water >> refilled x 10ds Wt has increased frmo 246 to 251 lbs in 3 mnths Pt states his breathing is still not doing well. He is still retaining fluid in ankles. Not able to use the bathrom.  Continues to smoke 1/2 PPD - Did not try nicotine patch - cannot afford  Review of Systems neg for any significant sore throat, dysphagia, itching, sneezing, nasal congestion or excess/ purulent secretions, fever, chills, sweats, unintended wt loss, pleuritic or exertional cp, hempoptysis, orthopnea pnd or change in chronic leg swelling. Also denies presyncope, palpitations, heartburn, abdominal pain, nausea, vomiting, diarrhea or change in bowel or urinary habits, dysuria,hematuria, rash, arthralgias, visual complaints, headache, numbness weakness or ataxia.     Objective:   Physical Exam  Gen. Pleasant, well-nourished, in no distress ENT - no lesions, no post nasal drip Neck: No JVD, no thyromegaly, no carotid bruits Lungs: no use of accessory muscles, no dullness to percussion, clear without rales or rhonchi  Cardiovascular: Rhythm regular, heart sounds  normal, no murmurs or gallops, 1+ peripheral edema Musculoskeletal: No deformities, no cyanosis or clubbing        Assessment & Plan:

## 2013-06-09 NOTE — Patient Instructions (Addendum)
Stay on symbicort 2 puffs twice daily Leg elevation during sleep Compression stockings

## 2013-06-10 LAB — BASIC METABOLIC PANEL
CO2: 22 mEq/L (ref 19–32)
Chloride: 109 mEq/L (ref 96–112)
Sodium: 141 mEq/L (ref 135–145)

## 2013-06-10 NOTE — Assessment & Plan Note (Signed)
Rpt CT scan in October for 53m FU

## 2013-06-10 NOTE — Assessment & Plan Note (Signed)
Stay on symbicort 2 puffs twice daily Leg elevation during sleep Compression stockings BMET ok - doubt more lasix will help

## 2013-06-11 ENCOUNTER — Encounter: Payer: Self-pay | Admitting: *Deleted

## 2013-06-29 ENCOUNTER — Ambulatory Visit (INDEPENDENT_AMBULATORY_CARE_PROVIDER_SITE_OTHER): Payer: No Typology Code available for payment source | Admitting: Pulmonary Disease

## 2013-06-29 ENCOUNTER — Encounter: Payer: Self-pay | Admitting: Pulmonary Disease

## 2013-06-29 VITALS — BP 112/72 | HR 76 | Temp 97.4°F | Ht 73.0 in | Wt 249.6 lb

## 2013-06-29 DIAGNOSIS — J45901 Unspecified asthma with (acute) exacerbation: Secondary | ICD-10-CM

## 2013-06-29 DIAGNOSIS — J45909 Unspecified asthma, uncomplicated: Secondary | ICD-10-CM | POA: Insufficient documentation

## 2013-06-29 MED ORDER — PREDNISONE 10 MG PO TABS: ORAL_TABLET | ORAL | Status: DC

## 2013-06-29 MED ORDER — CEFDINIR 300 MG PO CAPS: 600.0000 mg | ORAL_CAPSULE | ORAL | Status: DC

## 2013-06-29 NOTE — Addendum Note (Signed)
Addended by: Nita Sells on: 06/29/2013 04:45 PM   Modules accepted: Orders

## 2013-06-29 NOTE — Patient Instructions (Addendum)
Will treat with an 8 day course of prednisone to help with your airway inflammation and cough. Will treat with 5 days of omnicef 300mg , take 2 each am.  The most important thing to help with cough and chest soreness is total smoking cessation.  followup with Dr. Vassie Loll as scheduled, but call if you are not improving.

## 2013-06-29 NOTE — Assessment & Plan Note (Signed)
The patient is having increased cough with increased shortness of breath, as well as mucus of varying colors.  I suspect that he has acute asthmatic bronchitis related to his ongoing smoking, and does have some mild bronchospasm on exam.  I will treat him with a short course of prednisone as well as an antibiotic, but have stressed to him that total smoking cessation is his primary form of treatment.

## 2013-06-29 NOTE — Progress Notes (Signed)
  Subjective:    Patient ID: Kyle Sanders, male    DOB: 03-Feb-1971, 42 y.o.   MRN: [id number]  HPI Patient comes in today for an acute sick visit.  He is normally followed by Dr. Vassie Loll for mild reactive airways probably associated with ongoing smoking.  He gives a two-day history of increasing shortness of breath, cough with mucous appearing colors, and cough paroxysms leading to cough syncope.  Unfortunately he continues to smoke.  He does not think he is overly congested in his chest, but again he is producing purulent mucus.  He has not had any fevers, chills, or sweats.  He does have sharp chest pain whenever he coughs probably related to cartilage inflammation.   Review of Systems  Constitutional: Negative for fever and unexpected weight change.  HENT: Negative for ear pain, nosebleeds, congestion, sore throat, rhinorrhea, sneezing, trouble swallowing, dental problem, postnasal drip and sinus pressure.   Eyes: Negative for redness and itching.  Respiratory: Positive for cough, shortness of breath and wheezing. Negative for chest tightness.   Cardiovascular: Positive for chest pain. Negative for palpitations and leg swelling.  Gastrointestinal: Negative for nausea and vomiting.  Genitourinary: Negative for dysuria.  Musculoskeletal: Negative for joint swelling.  Skin: Negative for rash.  Neurological: Positive for syncope ( "black out spells") and light-headedness. Negative for headaches.  Hematological: Does not bruise/bleed easily.  Psychiatric/Behavioral: Negative for dysphoric mood. The patient is not nervous/anxious.        Objective:   Physical Exam Overweight male in no acute distress Nose without purulence or discharge noted Oropharynx clear Neck without lymphadenopathy or thyromegaly Chest with bilateral rhonchi, mild wheezing at the right base.  Adequate air flow noted. Cardiac exam with regular rate and rhythm Lower extremities with no significant edema, no cyanosis Alert  and oriented, moves all 4 extremities.        Assessment & Plan:

## 2013-06-30 ENCOUNTER — Telehealth: Payer: Self-pay | Admitting: Pulmonary Disease

## 2013-06-30 NOTE — Telephone Encounter (Signed)
Then he needs to go to a different pharmacy that keeps their shelves better stocked.

## 2013-06-30 NOTE — Telephone Encounter (Signed)
I spoke with Kyle Sanders the pharmacists. He stated they do not have omnicef in stock and they would have to order this. Also this will cost pt about $30. He stated they do have cefuroximine 500 mg, levaquin, azithromycin. Please advise KC thanks  Allergies  Allergen Reactions  . Lamictal (Lamotrigine) Rash

## 2013-06-30 NOTE — Telephone Encounter (Signed)
Spoke with patient, refused to switch to a different pharmacy d/t medications costing too much at other pharmacies. States he cannot afford to pay $30+ on this medication. Patient states that they do not have the Omnicef on the shelf but they do have Cefuroximine 500 mg, Levaquin  and Azithromycin. Patient requesting that the medication be switched to one of the above that they have in stock---these are cheaper at his pharmacy than they are at any other.  States if this cannot be done then he will just not take the Abx.  Please advise Dr Shelle Iron, pt is at pharmacy waiting. (aware response will be after 130)

## 2013-06-30 NOTE — Telephone Encounter (Signed)
I do not think those abx are appropriate. The other option is doxycycline 100mg  one bid for 5 days. If they don't have this, then pt will have to decide whether to take antibiotic or not.

## 2013-06-30 NOTE — Telephone Encounter (Signed)
[place], the pharmacist, states that pt is awaiting to p/u medication & had not heard back from [facility] or his nurse.  Requests a response since pt is awaiting Rx, please.  Thanks!  Antionette Fairy

## 2013-06-30 NOTE — Telephone Encounter (Signed)
Spoke with pharm tech and instructed to call back and leave vmail on machine per pharmacist. Left voicemail on machine [phone] d/t pharmacist being on lunch. Change abx to doxy 100mg  1 bid x 5 days  #10 Patient to be made aware once pharmacist returns to check voicemail--pt does not have a phone and is waiting at the pharmacy.

## 2013-07-08 ENCOUNTER — Telehealth: Payer: Self-pay | Admitting: Pulmonary Disease

## 2013-07-08 MED ORDER — LEVOFLOXACIN 750 MG PO TABS: 750.0000 mg | ORAL_TABLET | Freq: Every day | ORAL | Status: DC

## 2013-07-08 NOTE — Telephone Encounter (Signed)
This is Kyle Sanders's pt. Let him know the problem is that he keeps smoking, which delays the clearance of the infection, and actually causes recurrent infections. Can call in levaquin 750mg  one each day for 5 days.  If this doesn't help, needs ov with Kyle Sanders.

## 2013-07-08 NOTE — Telephone Encounter (Signed)
Pt advised and rx sent 

## 2013-07-08 NOTE — Telephone Encounter (Signed)
Last OV 06/29/13. I spoke with the pt and he states he finished an 8 day prednisone taper and doxy rx yesterday. He states he does not feel any improvement at all after taking meds. Pt states he is still having increased SOB, wheezing, productive cough with green phlegm, and sneezing. Pt staets he does not feel symptoms are worse, but not any better. Pt wants to know what other recs KC may have. Pt refuses an appt at this time. Carron Curie, CMA Allergies  Allergen Reactions  . Lamictal (Lamotrigine) Rash

## 2013-07-27 ENCOUNTER — Telehealth: Payer: Self-pay | Admitting: Pulmonary Disease

## 2013-07-27 ENCOUNTER — Ambulatory Visit (INDEPENDENT_AMBULATORY_CARE_PROVIDER_SITE_OTHER): Payer: No Typology Code available for payment source | Admitting: Pulmonary Disease

## 2013-07-27 ENCOUNTER — Encounter: Payer: Self-pay | Admitting: Pulmonary Disease

## 2013-07-27 VITALS — BP 118/70 | HR 99 | Temp 97.5°F | Ht 73.0 in | Wt 257.2 lb

## 2013-07-27 DIAGNOSIS — R05 Cough: Secondary | ICD-10-CM

## 2013-07-27 DIAGNOSIS — R55 Syncope and collapse: Secondary | ICD-10-CM

## 2013-07-27 DIAGNOSIS — R059 Cough, unspecified: Secondary | ICD-10-CM

## 2013-07-27 MED ORDER — BENZONATATE 200 MG PO CAPS: 200.0000 mg | ORAL_CAPSULE | Freq: Three times a day (TID) | ORAL | Status: DC | PRN

## 2013-07-27 MED ORDER — PANTOPRAZOLE SODIUM 40 MG PO TBEC: 40.0000 mg | DELAYED_RELEASE_TABLET | Freq: Every day | ORAL | Status: DC

## 2013-07-27 NOTE — Progress Notes (Signed)
Subjective:    Patient ID: Kyle Sanders, male    DOB: March 24, 1971, 42 y.o.   MRN: [id number]  HPI PCP -  42 year old heavy smoker presents for FU of dyspnea He has a history of bipolar disorder and substance abuse. He quit using street drugs such as cocaine in 1995. He had an extensive hospitalization in [place] in 2010 after an overdose of Seroquel when he required mechanical ventilation and cardiac resuscitation. 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 He also has a history of testicular cancer in remission since 2008.  He ambulates with a cane and reports dyspnea on exertion. He denies wheezing frequent chest colds or episodes of orthopnea or paroxysmal nocturnal dyspnea. PFTs in 2011 showed an FEV1 of 68% and FVC of 66% with a ratio of 85 suggesting moderate restriction. DLCO was decreased at 58% but corrected for alveolar volume. Lung volumes were decreased. He did have a 20% drop in FEV1 after broncho- challenge.  Spirometry today showed an FEV1 of 65% and FVC of 56% with a normal ratio against suggesting restriction  CXR 08/07/12 showed Bronchitic type lung changes suggesting bronchitis or interstitial pneumonitis.  Cardiac evaluation including nuclear imaging on 03/19/2013 was normal with an EF of 64%.  A recent drug screen was negative as was a hypercoagulable panel on review of his labs.  CT chest 04/2013 - sub cm nodules bilateral, mild bronchiectasis & scarring in lingula  PSG reviewed - No evidence of obstructive sleep apnea. Loud snoring was noted.    06/09/2013  ON last visit - trial of lasix for pedal edema ,symbicort  The symbicort 160 has not helped with his breathing. He stated he would use this after his breathing TX and it would help get him a deeper breath but would not last very long. He still feels SOB. He also states the lasix has not taken any of the fluid off of him. >> increased to lasix 40 x 5ds  He stated he  is still retaining fluid in his legs and stomach. He stated he is still over 250 lbs. He is not drinking soda but is drinking water >> refilled x 10ds  Wt has increased frmo 246 to 251 lbs in 3 mnths  Pt states his breathing is still not doing well. He is still retaining fluid in ankles. Not able to use the bathrom.  Continues to smoke 1/2 PPD - Did not try nicotine patch - cannot afford      07/27/2013 Saw my partner 7/14 & given pred & doxy for acute asthmatic bronchitis Pt c/o increase SOB. Saw KC 06/29/13 but no better. C/o wheezing, chest tx, dry cough, chest congestion. Pt also reports at times when he coughs he "blacks out" but still feels conscious. Pt reports this has happened twice since last OV w/ KC. He had episodes of blacking out even without coughing  Quit  Smoking 1 mnth ago MRI & epilepsy monitoring planned at [facility]  Past Medical History  Diagnosis Date  . Cancer   . Stroke   . Seizures   . Bipolar 1 disorder      Review of Systems neg for any significant sore throat, dysphagia, itching, sneezing, nasal congestion or excess/ purulent secretions, fever, chills, sweats, unintended wt loss, pleuritic or exertional cp, hempoptysis, orthopnea pnd or change in chronic leg swelling. Also denies presyncope, palpitations, heartburn, abdominal pain, nausea, vomiting, diarrhea or change in bowel or urinary habits,  dysuria,hematuria, rash, arthralgias, visual complaints, headache, numbness weakness or ataxia.     Objective:   Physical Exam  Gen. Pleasant, well-nourished, in no distress, normal affect ENT - no lesions, no post nasal drip Neck: No JVD, no thyromegaly, no carotid bruits Lungs: no use of accessory muscles, no dullness to percussion, clear without rales or rhonchi  Cardiovascular: Rhythm regular, heart sounds  normal, no murmurs or gallops, no peripheral edema Abdomen: soft and non-tender, no hepatosplenomegaly, BS normal. Musculoskeletal: No deformities, no  cyanosis or clubbing Neuro:  alert, non focal       Assessment & Plan:

## 2013-07-27 NOTE — Telephone Encounter (Signed)
I have not received anything. Usually disability goes through health port so they may still have these forms if it was received. He stated he has resent this fax up front. Will await fax and sign off message

## 2013-07-27 NOTE — Assessment & Plan Note (Signed)
We will try empirical treatment of cough For sinus drip, take chlorphenirmaine 8 mg at bedtime (may make you sleepy ) x 4 wks Sudafed 120 mg XL daily x 4 weeks Take protonix 40 mg daily x 1 month Take benzonatate 200 mg thrice daily as needed for cough MRI & epilepsy monitoring planned at [facility] 

## 2013-07-27 NOTE — Patient Instructions (Addendum)
We will try empirical treatment of cough For sinus drip, take chlorphenirmaine 8 mg at bedtime (may make you sleepy ) x 4 wks Sudafed 120 mg XL daily x 4 weeks Take protonix 40 mg daily x 1 month Take benzonatate 200 mg thrice daily as needed for cough MRI & epilepsy monitoring planned at [facility]

## 2013-07-27 NOTE — Telephone Encounter (Signed)
I spoke with pt. He stated he is very SOB and "blackening out". He stated KC treatment did not work out and has been to the ER recently d/t this. No available openings today with any provider. I scheduled pt to come in and see RA at 1:30 in the [place] office. Will forward to RA as an [place]

## 2013-07-28 DIAGNOSIS — R55 Syncope and collapse: Secondary | ICD-10-CM | POA: Insufficient documentation

## 2013-07-28 NOTE — Assessment & Plan Note (Signed)
Has occurred without cough - hence not all cough syncope Orthostatics neg Neurology wu incl epilepsy monitoring is planned Can consider holter if not done

## 2013-07-30 ENCOUNTER — Telehealth: Payer: Self-pay | Admitting: Pulmonary Disease

## 2013-07-30 DIAGNOSIS — R911 Solitary pulmonary nodule: Secondary | ICD-10-CM

## 2013-07-30 NOTE — Telephone Encounter (Signed)
CT angio  07/18/13 at [facility] showed stable RLL nodule x Can arrange for next CT in 1 yr- 07/2014

## 2013-07-31 NOTE — Telephone Encounter (Signed)
Pt aware of results. He has CT scheduled for 09/2013. This needs to be cancelled and r/s for 07/2014. Please advise PCC's thanks  --not sure if this needs precert since diff date reason asking PCC's

## 2013-08-03 NOTE — Telephone Encounter (Signed)
Order has been placed.

## 2013-08-03 NOTE — Telephone Encounter (Signed)
Oct appt has been cancelled in [place] i will need a new order for 07/2014 thanks Tobe Sos

## 2013-08-06 ENCOUNTER — Telehealth: Payer: Self-pay | Admitting: Pulmonary Disease

## 2013-08-06 NOTE — Telephone Encounter (Signed)
done

## 2013-08-06 NOTE — Telephone Encounter (Signed)
Fax placed in [facility] look at. Will forward to him as [place]

## 2013-08-06 NOTE — Telephone Encounter (Signed)
Fax received

## 2013-08-06 NOTE — Telephone Encounter (Signed)
I have not received anything. I gave pt triage fax #. Will await fax

## 2013-08-07 NOTE — Telephone Encounter (Signed)
Form has been faxed back.

## 2013-09-01 ENCOUNTER — Encounter: Payer: Self-pay | Admitting: Adult Health

## 2013-09-01 ENCOUNTER — Ambulatory Visit (INDEPENDENT_AMBULATORY_CARE_PROVIDER_SITE_OTHER): Payer: Medicaid Other | Admitting: Adult Health

## 2013-09-01 VITALS — BP 122/82 | HR 82 | Temp 99.0°F | Ht 73.0 in | Wt 260.0 lb

## 2013-09-01 DIAGNOSIS — J45901 Unspecified asthma with (acute) exacerbation: Secondary | ICD-10-CM

## 2013-09-01 DIAGNOSIS — J45909 Unspecified asthma, uncomplicated: Secondary | ICD-10-CM

## 2013-09-01 MED ORDER — ALBUTEROL SULFATE (2.5 MG/3ML) 0.083% IN NEBU: 2.5000 mg | INHALATION_SOLUTION | Freq: Four times a day (QID) | RESPIRATORY_TRACT | Status: DC | PRN

## 2013-09-01 MED ORDER — AMOXICILLIN-POT CLAVULANATE 875-125 MG PO TABS: 1.0000 | ORAL_TABLET | Freq: Two times a day (BID) | ORAL | Status: AC

## 2013-09-01 NOTE — Assessment & Plan Note (Addendum)
Flare with cyclical cough  Encourage on cough control triggers with GERD and AR prevention   Plan  Augmentin 875mg  Twice daily  For 7 days  Mucinex DM Twice daily  As needed  Cough/congestion .  Try to get chlor tab 4mg  2 tabs At bedtime  -this is over the counter and is relatively inexpensive . This is to help with tickle in throat /sinus drainage.  Try to cut out all smoking .  follow up Dr. Vassie Loll  In 3 months and As needed   Please contact office for sooner follow up if symptoms do not improve or worsen or seek emergency care

## 2013-09-01 NOTE — Patient Instructions (Addendum)
Augmentin 875mg  Twice daily  For 7 days  Mucinex DM Twice daily  As needed  Cough/congestion .  Try to get chlor tab 4mg  2 tabs At bedtime  -this is over the counter and is relatively inexpensive . This is to help with tickle in throat /sinus drainage.  Try to cut out all smoking .  follow up Dr. Vassie Loll  In 3 months and As needed   Please contact office for sooner follow up if symptoms do not improve or worsen or seek emergency care

## 2013-09-01 NOTE — Progress Notes (Signed)
Subjective:    Patient ID: Kyle Sanders, male    DOB: 16-Mar-1971, 42 y.o.   MRN: [id number]  HPI  PCP - 42 year old heavy smoker presents for FU of dyspnea He has a history of bipolar disorder and substance abuse. He quit using street drugs such as cocaine in 1995. He had an extensive hospitalization in [place] in 2010 after an overdose of Seroquel when he required mechanical ventilation and cardiac resuscitation. 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 He also has a history of testicular cancer in remission since 2008.  He ambulates with a cane and reports dyspnea on exertion. He denies wheezing frequent chest colds or episodes of orthopnea or paroxysmal nocturnal dyspnea. PFTs in 2011 showed an FEV1 of 68% and FVC of 66% with a ratio of 85 suggesting moderate restriction. DLCO was decreased at 58% but corrected for alveolar volume. Lung volumes were decreased. He did have a 20% drop in FEV1 after broncho- challenge.  Spirometry today showed an FEV1 of 65% and FVC of 56% with a normal ratio against suggesting restriction  CXR 08/07/12 showed Bronchitic type lung changes suggesting bronchitis or interstitial pneumonitis.  Cardiac evaluation including nuclear imaging on 03/19/2013 was normal with an EF of 64%.  A recent drug screen was negative as was a hypercoagulable panel on review of his labs.  CT chest 04/2013 - sub cm nodules bilateral, mild bronchiectasis & scarring in lingula  PSG reviewed - No evidence of obstructive sleep apnea. Loud snoring was noted.    06/09/2013  ON last visit - trial of lasix for pedal edema ,symbicort  The symbicort 160 has not helped with his breathing. He stated he would use this after his breathing TX and it would help get him a deeper breath but would not last very long. He still feels SOB. He also states the lasix has not taken any of the fluid off of him. >> increased to lasix 40 x 5ds  He stated he  is still retaining fluid in his legs and stomach. He stated he is still over 250 lbs. He is not drinking soda but is drinking water >> refilled x 10ds  Wt has increased frmo 246 to 251 lbs in 3 mnths  Pt states his breathing is still not doing well. He is still retaining fluid in ankles. Not able to use the bathrom.  Continues to smoke 1/2 PPD - Did not try nicotine patch - cannot afford      07/27/13  Saw my partner 7/14 & given pred & doxy for acute asthmatic bronchitis Pt c/o increase SOB. Saw [facility] 06/29/13 but no better. C/o wheezing, chest tx, dry cough, chest congestion. Pt also reports at times when he coughs he "blacks out" but still feels conscious. Pt reports this has happened twice since last OV w/ [facility]. He had episodes of blacking out even without coughing  Quit  Smoking 1 mnth ago MRI & epilepsy monitoring planned at [facility] >> chlorphenirmaine 8 mg at bedtime  , Sudafed 120 mg XL, PPI and tessalon.   09/01/2013 Follow up  Pt states he is still SOB w/ activity and at rest, cough w/ occasional yellow-dark green phlem.  He also reports wheezing and chest tightness . Worse for last 1 week.  No otc used.  No recent travel,  Did not take tessalon, chlor tab, medicaid did not cover.  Does not have money to get meds.  No smoking  cigarettes, smoking e cigs.   07/18/13 CXR with no acute findings.  Over last year wt has increased 50lbs .  Currently undergoing workup for weight gain.  Recently dx with low -T.  On abilify , buspar, and pristiq for bipolar dz. -recently changed.  No chest pain , orthopnea, increased leg swelling, fever or hemoptysis.    Past Medical History  Diagnosis Date  . Cancer   . Stroke   . Seizures   . Bipolar 1 disorder      Review of Systems  neg for any significant sore throat, dysphagia, itching, sneezing, nasal congestion or excess/ purulent secretions, fever, chills, sweats, unintended wt loss, pleuritic or exertional cp, hempoptysis, orthopnea pnd or  change in chronic leg swelling. Also denies presyncope, palpitations, heartburn, abdominal pain, nausea, vomiting, diarrhea or change in bowel or urinary habits, dysuria,hematuria, rash, arthralgias, visual complaints, headache, numbness weakness or ataxia.     Objective:   Physical Exam   Gen. Pleasant, obese  in no distress, normal affect ENT - no lesions, no post nasal drip Neck: No JVD, no thyromegaly, no carotid bruits Lungs: no use of accessory muscles, no dullness to percussion, clear without rales or rhonchi  Cardiovascular: Rhythm regular, heart sounds  normal, no murmurs or gallops, tr peripheral edema Abdomen: soft and non-tender, no hepatosplenomegaly, BS normal. Musculoskeletal: No deformities, no cyanosis or clubbing Neuro:  alert, non focal       Assessment & Plan:

## 2013-09-30 ENCOUNTER — Encounter: Payer: Self-pay | Admitting: *Deleted

## 2013-09-30 ENCOUNTER — Telehealth: Payer: Self-pay | Admitting: Pulmonary Disease

## 2013-09-30 MED ORDER — PANTOPRAZOLE SODIUM 40 MG PO TBEC: 40.0000 mg | DELAYED_RELEASE_TABLET | Freq: Every day | ORAL | Status: DC

## 2013-09-30 NOTE — Telephone Encounter (Signed)
I spoke with pt. I advised him will fax letter over to fax # provided. Nothing further needed

## 2013-09-30 NOTE — Telephone Encounter (Signed)
Pt aware that rx was called in to pharmacy

## 2013-09-30 NOTE — Telephone Encounter (Signed)
RX has been sent for pt.  lmtcb x1 for pt

## 2013-10-05 ENCOUNTER — Other Ambulatory Visit: Payer: No Typology Code available for payment source

## 2013-10-05 ENCOUNTER — Other Ambulatory Visit (HOSPITAL_BASED_OUTPATIENT_CLINIC_OR_DEPARTMENT_OTHER): Payer: No Typology Code available for payment source

## 2013-12-31 ENCOUNTER — Ambulatory Visit (INDEPENDENT_AMBULATORY_CARE_PROVIDER_SITE_OTHER): Payer: Medicaid Other | Admitting: Adult Health

## 2013-12-31 ENCOUNTER — Encounter: Payer: Self-pay | Admitting: Adult Health

## 2013-12-31 ENCOUNTER — Ambulatory Visit: Payer: Medicaid Other | Admitting: Pulmonary Disease

## 2013-12-31 VITALS — BP 114/76 | HR 80 | Temp 98.4°F | Wt 242.0 lb

## 2013-12-31 DIAGNOSIS — R0609 Other forms of dyspnea: Secondary | ICD-10-CM

## 2013-12-31 DIAGNOSIS — R0600 Dyspnea, unspecified: Secondary | ICD-10-CM

## 2013-12-31 DIAGNOSIS — R0989 Other specified symptoms and signs involving the circulatory and respiratory systems: Secondary | ICD-10-CM

## 2013-12-31 DIAGNOSIS — R911 Solitary pulmonary nodule: Secondary | ICD-10-CM

## 2013-12-31 NOTE — Patient Instructions (Signed)
Take Symbicort 2 puffs Twice daily  , take this everyday-try to not miss any doses.  Rinse after inhaler use.  Mucinex DM Twice daily  As needed  Cough/congestion .  Try to cut out all smoking . -This is your most important goal.  We are setting you up for a CT scan to follow lung nodules.  Follow up Dr. Elsworth Soho  In 3 months and As needed   Please contact office for sooner follow up if symptoms do not improve or worsen or seek emergency care

## 2013-12-31 NOTE — Progress Notes (Signed)
Subjective:    Patient ID: Kyle Sanders, male    DOB: [birth year], 43 y.o.   MRN: [id number]  HPI  PCP - 43 year old heavy smoker presents for FU of dyspnea He has a history of bipolar disorder and substance abuse. He quit using street drugs such as cocaine in 1995. He had an extensive hospitalization in [place] in 2010 after an overdose of Seroquel when he required mechanical ventilation and cardiac resuscitation. 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 He also has a history of testicular cancer in remission since 2008.  He ambulates with a cane and reports dyspnea on exertion. He denies wheezing frequent chest colds or episodes of orthopnea or paroxysmal nocturnal dyspnea. PFTs in 2011 showed an FEV1 of 68% and FVC of 66% with a ratio of 85 suggesting moderate restriction. DLCO was decreased at 58% but corrected for alveolar volume. Lung volumes were decreased. He did have a 20% drop in FEV1 after broncho- challenge.  Spirometry today showed an FEV1 of 65% and FVC of 56% with a normal ratio  suggesting restriction  CXR 08/07/12 showed Bronchitic type lung changes suggesting bronchitis or interstitial pneumonitis.  Cardiac evaluation including nuclear imaging on 03/19/2013 was normal with an EF of 64%.  A recent drug screen was negative as was a hypercoagulable panel on review of his labs.  CT chest 04/2013 - sub cm nodules bilateral, mild bronchiectasis & scarring in lingula  PSG reviewed - No evidence of obstructive sleep apnea. Loud snoring was noted.    06/09/2013  ON last visit - trial of lasix for pedal edema ,symbicort  The symbicort 160 has not helped with his breathing. He stated he would use this after his breathing TX and it would help get him a deeper breath but would not last very long. He still feels SOB. He also states the lasix has not taken any of the fluid off of him. >> increased to lasix 40 x 5ds  He stated he is still  retaining fluid in his legs and stomach. He stated he is still over 250 lbs. He is not drinking soda but is drinking water >> refilled x 10ds  Wt has increased frmo 246 to 251 lbs in 3 mnths  Pt states his breathing is still not doing well. He is still retaining fluid in ankles. Not able to use the bathrom.  Continues to smoke 1/2 PPD - Did not try nicotine patch - cannot afford      07/27/13  Saw my partner 7/14 & given pred & doxy for acute asthmatic bronchitis Pt c/o increase SOB. Saw [facility] 06/29/13 but no better. C/o wheezing, chest tx, dry cough, chest congestion. Pt also reports at times when he coughs he "blacks out" but still feels conscious. Pt reports this has happened twice since last OV w/ [facility]. He had episodes of blacking out even without coughing  Quit  Smoking 1 mnth ago MRI & epilepsy monitoring planned at [facility] >> chlorphenirmaine 8 mg at bedtime  , Sudafed 120 mg XL, PPI and tessalon.   09/01/13  Follow up  Pt states he is still SOB w/ activity and at rest, cough w/ occasional yellow-dark green phlem.  He also reports wheezing and chest tightness . Worse for last 1 week.  No otc used.  No recent travel,  Did not take tessalon, chlor tab, medicaid did not cover.  Does not have money to get meds.  No  smoking cigarettes, smoking e cigs.   07/18/13 CXR with no acute findings.  Over last year wt has increased 50lbs .  Currently undergoing workup for weight gain.  Recently dx with low -T.  On abilify , buspar, and pristiq for bipolar dz. -recently changed.  No chest pain , orthopnea, increased leg swelling, fever or hemoptysis.  >>Augmentin rx   12/31/2013 Follow up  Returns for follow up  Complains of worsening dypsnea /DOE, chest congestion with tightness, productive cough w/ thick mucus.  chest congestion, wheezing. Considers this his normal . No fever , discolored mucus.  Denies any PND, no nasal congetion. Finished cipro last week for severe bladder infection.  Wt down  18lbs.  Continues to smoke , discussed smoking cessation . Has cut back 1/2 PPD .  Continues on Symbicort Twice daily  -admits to missing doses at times.  Using Albuterol nebs none to 1 x daily .    Past Medical History  Diagnosis Date  . Cancer   . Stroke   . Seizures   . Bipolar 1 disorder      Review of Systems  neg for any significant sore throat, dysphagia, itching, sneezing, nasal congestion or excess/ purulent secretions, fever, chills, sweats, unintended wt loss, pleuritic or exertional cp, hempoptysis, orthopnea pnd or change in chronic leg swelling. Also denies presyncope, palpitations, heartburn, abdominal pain, nausea, vomiting, diarrhea or change in bowel or urinary habits, dysuria,hematuria, rash, arthralgias, visual complaints, headache, numbness weakness or ataxia.     Objective:   Physical Exam   Gen. Pleasant, obese  in no distress, normal affect ENT - no lesions, no post nasal drip Neck: No JVD, no thyromegaly, no carotid bruits Lungs: no use of accessory muscles, no dullness to percussion, clear without rales or rhonchi  Cardiovascular: Rhythm regular, heart sounds  normal, no murmurs or gallops, tr peripheral edema Abdomen: soft and non-tender, no hepatosplenomegaly, BS normal. Musculoskeletal: No deformities, no cyanosis or clubbing Neuro:  alert, non focal       Assessment & Plan:

## 2013-12-31 NOTE — Assessment & Plan Note (Signed)
?  Asthma in heavy smoker ,w/ no airflow obstruction on PFT - mod restriction noted . Emphysema/scarring and bronchiectatic changes on CT with ongoing smoking -suspect this contributes to his underlying resp issues.  Will cont on symbicort . Encouraged on smoking cessation   Plan Take Symbicort 2 puffs Twice daily  , take this everyday-try to not miss any doses.  Rinse after inhaler use.  Mucinex DM Twice daily  As needed  Cough/congestion .  Try to cut out all smoking . -This is your most important goal.    Follow up Dr. Elsworth Soho  In 3 months and As needed   Please contact office for sooner follow up if symptoms do not improve or worsen or seek emergency care

## 2013-12-31 NOTE — Assessment & Plan Note (Signed)
We are setting you up for a CT scan to follow lung nodules.  Follow up Dr. Elsworth Soho  In 3 months and As needed   Please contact office for sooner follow up if symptoms do not improve or worsen or seek emergency care

## 2014-01-06 ENCOUNTER — Ambulatory Visit (HOSPITAL_BASED_OUTPATIENT_CLINIC_OR_DEPARTMENT_OTHER)
Admission: RE | Admit: 2014-01-06 | Discharge: 2014-01-06 | Disposition: A | Discharge status: Home / Self Care | Payer: Medicaid Other | Source: Ambulatory Visit | Attending: Adult Health | Admitting: Adult Health

## 2014-01-06 DIAGNOSIS — R911 Solitary pulmonary nodule: Secondary | ICD-10-CM

## 2014-01-06 DIAGNOSIS — J438 Other emphysema: Secondary | ICD-10-CM | POA: Insufficient documentation

## 2014-01-06 DIAGNOSIS — Z09 Encounter for follow-up examination after completed treatment for conditions other than malignant neoplasm: Secondary | ICD-10-CM | POA: Insufficient documentation

## 2014-01-06 DIAGNOSIS — R918 Other nonspecific abnormal finding of lung field: Secondary | ICD-10-CM | POA: Insufficient documentation

## 2014-01-07 ENCOUNTER — Encounter: Payer: Self-pay | Admitting: Adult Health

## 2014-04-09 IMAGING — CR DG CHEST 2V
2 series · 2 of 2 positions shown · non-contrast
Comparison: None

CLINICAL DATA: Chest pain and shortness of breath.

CHEST - 2 VIEW

[w chest pa]
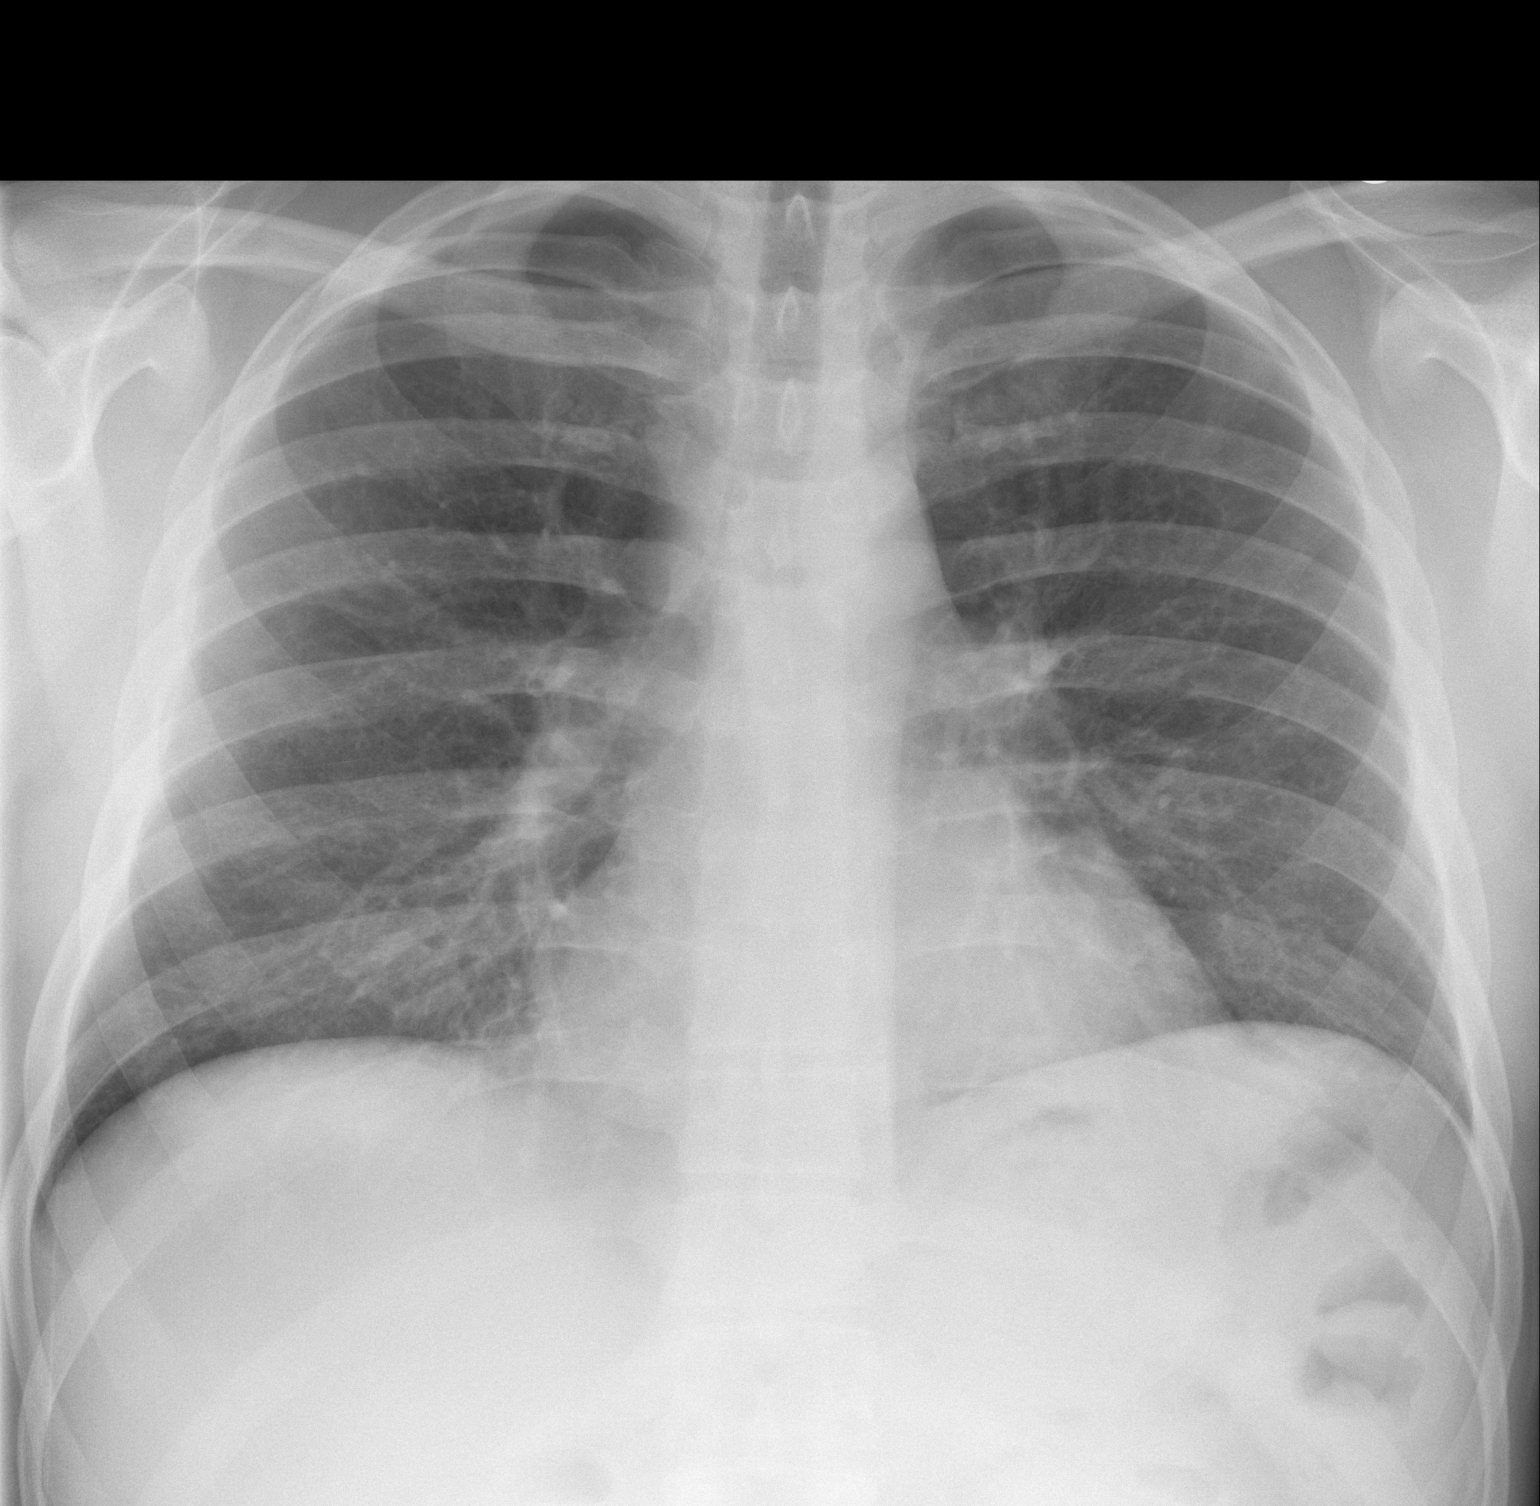

[w chest lat]
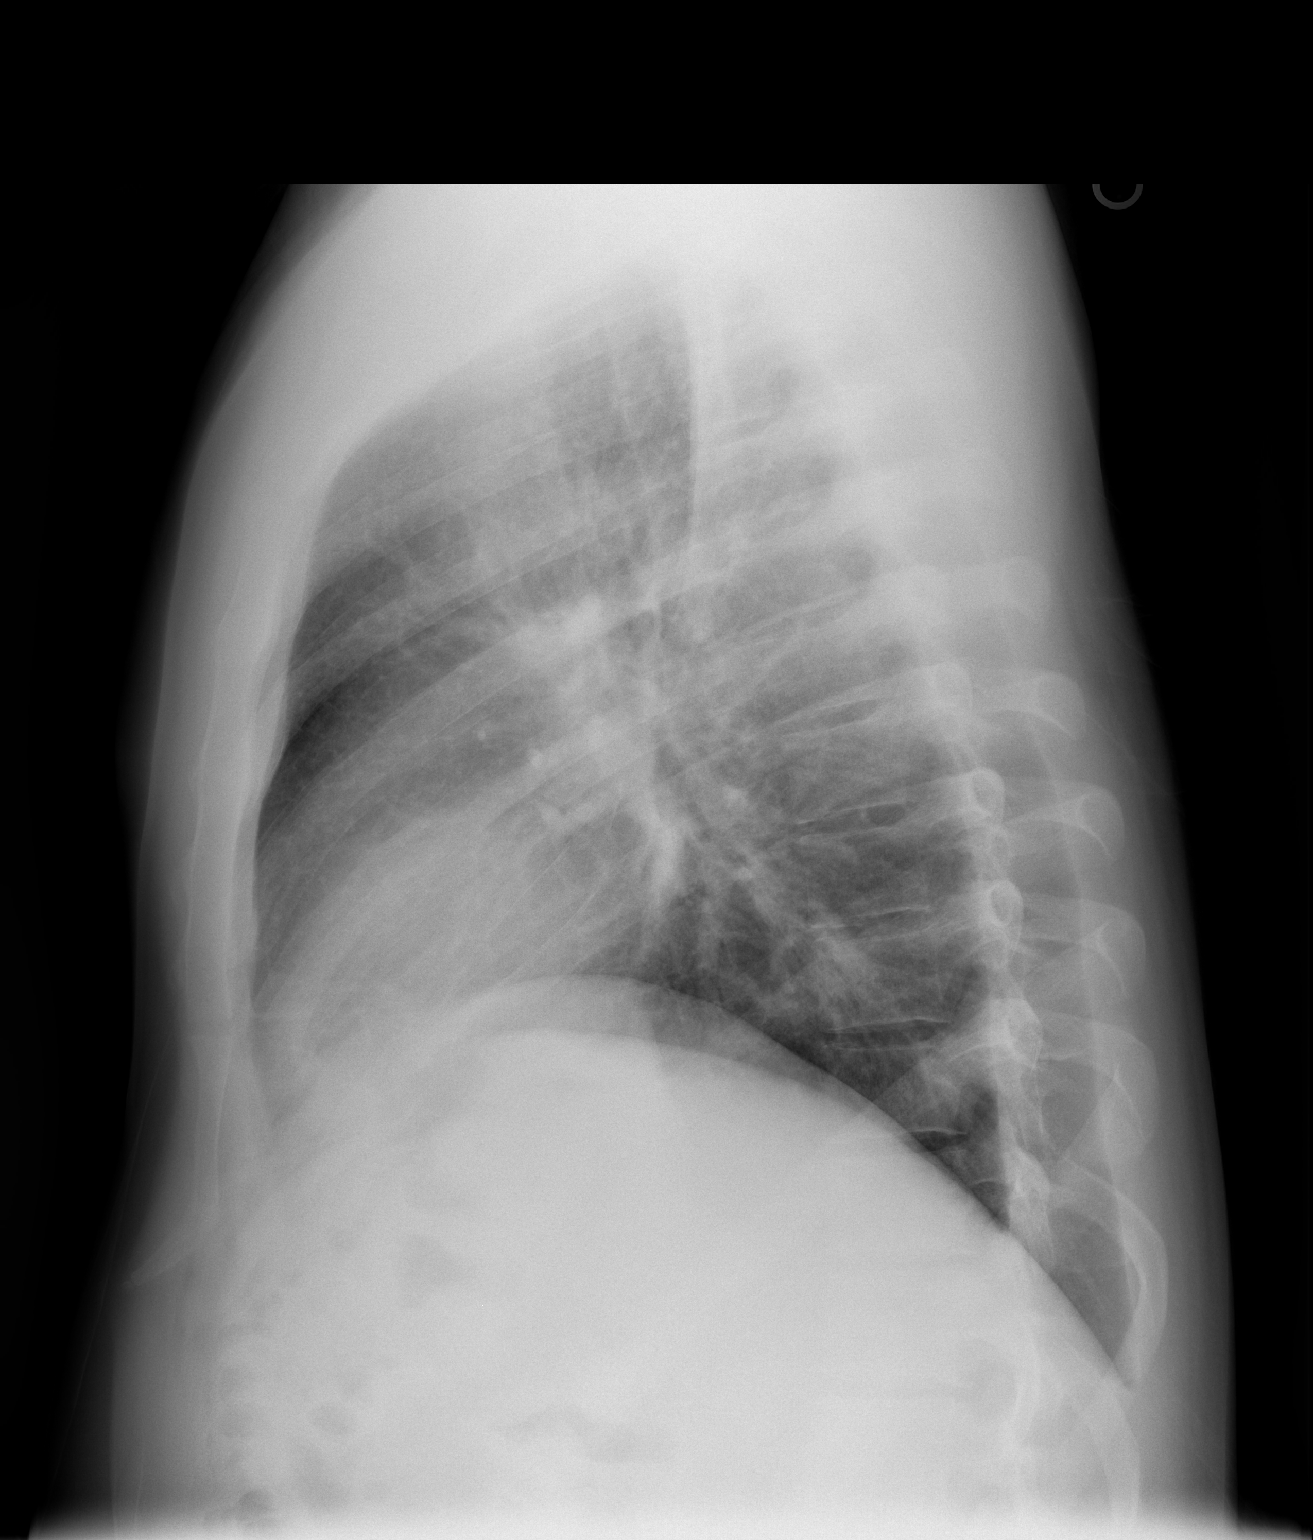

[2 of 2 positions shown; findings below may reference images not displayed]

FINDINGS: The cardiac silhouette, mediastinal and hilar contours
are within normal limits.  There are bronchitic type lung changes
with peribronchial thickening and increased interstitial markings
suggesting bronchitis or interstitial pneumonitis.  No focal
airspace consolidation or pleural effusion.  The bony thorax is
intact.
IMPRESSION: Bronchitic type lung changes suggesting bronchitis or interstitial
pneumonitis.  No focal airspace consolidation.

## 2014-06-04 ENCOUNTER — Other Ambulatory Visit (HOSPITAL_COMMUNITY): Payer: Self-pay | Admitting: Neurosurgery

## 2014-06-04 ENCOUNTER — Other Ambulatory Visit: Payer: Self-pay | Admitting: Neurosurgery

## 2014-06-04 DIAGNOSIS — IMO0002 Reserved for concepts with insufficient information to code with codable children: Secondary | ICD-10-CM

## 2014-06-04 DIAGNOSIS — B699 Cysticercosis, unspecified: Secondary | ICD-10-CM

## 2014-06-04 DIAGNOSIS — M542 Cervicalgia: Secondary | ICD-10-CM

## 2014-07-12 ENCOUNTER — Encounter (HOSPITAL_COMMUNITY): Payer: Self-pay | Admitting: Pharmacy Technician

## 2014-07-16 ENCOUNTER — Ambulatory Visit (HOSPITAL_COMMUNITY)
Admission: RE | Admit: 2014-07-16 | Discharge: 2014-07-16 | Disposition: A | Discharge status: Home / Self Care | Payer: Medicare Other | Source: Ambulatory Visit | Attending: Neurosurgery | Admitting: Neurosurgery

## 2014-07-16 DIAGNOSIS — IMO0002 Reserved for concepts with insufficient information to code with codable children: Secondary | ICD-10-CM

## 2014-07-16 DIAGNOSIS — M47812 Spondylosis without myelopathy or radiculopathy, cervical region: Secondary | ICD-10-CM | POA: Diagnosis not present

## 2014-07-16 DIAGNOSIS — M48061 Spinal stenosis, lumbar region without neurogenic claudication: Secondary | ICD-10-CM | POA: Diagnosis not present

## 2014-07-16 DIAGNOSIS — M542 Cervicalgia: Secondary | ICD-10-CM

## 2014-07-16 DIAGNOSIS — B699 Cysticercosis, unspecified: Secondary | ICD-10-CM | POA: Diagnosis present

## 2014-07-16 MED ORDER — DIAZEPAM 5 MG PO TABS
10.0000 mg | ORAL_TABLET | Freq: Once | ORAL | Status: AC
  Administered 2014-07-16: 10 mg via ORAL

## 2014-07-16 MED ORDER — ONDANSETRON HCL 4 MG/2ML IJ SOLN: 4.0000 mg | Freq: Four times a day (QID) | INTRAMUSCULAR | Status: DC | PRN

## 2014-07-16 MED ORDER — OXYCODONE HCL 5 MG PO TABS
ORAL_TABLET | ORAL | Status: AC
  Filled 2014-07-16: qty 2

## 2014-07-16 MED ORDER — DIAZEPAM 5 MG PO TABS
ORAL_TABLET | ORAL | Status: AC
  Administered 2014-07-16: 10 mg via ORAL
  Filled 2014-07-16: qty 2

## 2014-07-16 MED ORDER — OXYCODONE HCL 5 MG PO TABS
10.0000 mg | ORAL_TABLET | Freq: Once | ORAL | Status: AC
  Administered 2014-07-16: 10 mg via ORAL
  Filled 2014-07-16: qty 2

## 2014-07-16 MED ORDER — IOHEXOL 300 MG/ML  SOLN
10.0000 mL | Freq: Once | INTRAMUSCULAR | Status: AC | PRN
  Administered 2014-07-16: 10 mL via INTRATHECAL

## 2014-07-16 NOTE — Procedures (Signed)
No note

## 2014-07-16 NOTE — Discharge Instructions (Signed)

## 2014-08-03 ENCOUNTER — Inpatient Hospital Stay: Admission: RE | Admit: 2014-08-03 | Payer: Medicaid Other | Source: Ambulatory Visit

## 2014-09-16 ENCOUNTER — Telehealth: Payer: Self-pay | Admitting: Adult Health

## 2014-09-16 MED ORDER — ALBUTEROL SULFATE (2.5 MG/3ML) 0.083% IN NEBU: 2.5000 mg | INHALATION_SOLUTION | Freq: Four times a day (QID) | RESPIRATORY_TRACT | Status: DC | PRN

## 2014-09-16 NOTE — Telephone Encounter (Signed)
Received Physician Order from [facility] for pt's Albuterol neb RA pt last seen on 1.15.15 by TP Pt was recommended to follow up w/ RA 3 months from that visit and repeat his CT Chest 6 months from that time - neither have been done. The Albuterol neb was last refilled 9.16.14 #76m w/ 3 refills  Discussed with TP: please call pt.  Why has he not followed up as recommended?  Will need follow up w/ RA.  LMOM TCB x1 for pt.

## 2014-09-16 NOTE — Telephone Encounter (Signed)
Pt returned call [phone]

## 2014-09-16 NOTE — Telephone Encounter (Signed)
Called and spoke with pt and appt has been scheduled for pt to see RA on 11-17.  He is aware and i have mailed him an appt card.  Pt is aware that his medication has been sent to the pharmacy for his medication.

## 2014-11-02 ENCOUNTER — Ambulatory Visit: Payer: Medicare Other | Admitting: Pulmonary Disease

## 2015-02-07 ENCOUNTER — Other Ambulatory Visit: Payer: Self-pay | Admitting: Pulmonary Disease

## 2015-03-10 ENCOUNTER — Other Ambulatory Visit: Payer: Self-pay | Admitting: Pulmonary Disease

## 2015-03-31 ENCOUNTER — Other Ambulatory Visit: Payer: Self-pay | Admitting: Pulmonary Disease
